# Patient Record
Sex: Male | Born: 1985 | Race: White | Hispanic: No | Marital: Single | Smoking: Current every day smoker
Health system: Southern US, Community
[De-identification: ages and names within clinical notes are randomized; demographics above are authoritative.]

## PROBLEM LIST (undated history)

## (undated) DIAGNOSIS — M1990 Unspecified osteoarthritis, unspecified site: Secondary | ICD-10-CM

## (undated) HISTORY — DX: Unspecified osteoarthritis, unspecified site: M19.90

---
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
## 2007-04-24 ENCOUNTER — Ambulatory Visit: Payer: Self-pay | Admitting: Pediatrics

## 2008-12-18 ENCOUNTER — Emergency Department (HOSPITAL_BASED_OUTPATIENT_CLINIC_OR_DEPARTMENT_OTHER): Admission: EM | Admit: 2008-12-18 | Discharge: 2008-12-18 | Payer: Self-pay | Admitting: Emergency Medicine

## 2015-02-05 ENCOUNTER — Ambulatory Visit (INDEPENDENT_AMBULATORY_CARE_PROVIDER_SITE_OTHER): Payer: BLUE CROSS/BLUE SHIELD | Admitting: Physician Assistant

## 2015-02-05 VITALS — BP 128/80 | HR 78 | Temp 98.2°F | Resp 18 | Ht 68.0 in | Wt 119.0 lb

## 2015-02-05 DIAGNOSIS — M25511 Pain in right shoulder: Secondary | ICD-10-CM | POA: Diagnosis not present

## 2015-02-05 MED ORDER — IBUPROFEN 800 MG PO TABS: ORAL_TABLET | ORAL | Status: AC

## 2015-02-05 NOTE — Progress Notes (Signed)
   02/05/2015 at 7:54 PM  Edward Johns / DOB: March 04, 1986 / MRN: [id number]  The patient  does not have a problem list on file.  SUBJECTIVE  Edward Johns is a 29 y.o. well appearing male presenting for the chief complaint of right sided shoulder pain that started last night.  Describes the pain as moderate, achy, and worse with movement. 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  Denies chest pain, SOB, neck pain, paresthesia and weakness of the right upper extremity. Denies sensation change in same.     He  has a past medical history of Arthritis.    Medications reviewed and updated by myself where necessary, and exist elsewhere in the encounter.   Edward Johns has no allergies on file. He  reports that he has been smoking.  He does not have any smokeless tobacco history on file. He reports that he drinks alcohol. He reports that he does not use illicit drugs. He  has no sexual activity history on file. The patient  has no past surgical history on file.  His family history is not on file.  ROS  Per HPI otherwise neg  OBJECTIVE  His  height is  (1.727 m) and weight is 119 lb (53.978 kg). His oral temperature is 98.2 F (36.8 C). His blood pressure is 128/80 and his pulse is 78. His respiration is 18 and oxygen saturation is 98%.  The patient's body mass index is 18.1 kg/(m^2).  Physical Exam  Constitutional: He is oriented to person, place, and time. He appears well-developed and well-nourished.  Cardiovascular: Normal rate.   Respiratory: Effort normal.  GI: He exhibits no distension.  Musculoskeletal: Normal range of motion.       Right shoulder: He exhibits tenderness and pain. He exhibits normal range of motion, no bony tenderness, no swelling, no effusion, no crepitus, no deformity, no laceration, no spasm, normal pulse and normal strength.       Arms: Neurological: He is alert and oriented to person, place,  and time. No cranial nerve deficit. Coordination normal.  Skin: Skin is warm and dry.  Psychiatric: He has a normal mood and affect.    No results found for this or any previous visit (from the past 24 hour(s)).  ASSESSMENT & PLAN  Edward Johns was seen today for shoulder pain.  Diagnoses and all orders for this visit:  Right shoulder pain: Most likely MSK in nature given that his pain is worse with movement and there is no trauma to speak of.  Will treat with NSAIDs, shoulder sling, and 2 weeks of light duty at work.  Patient advised to RTC in three weeks if symptoms are not improved.   -     ibuprofen (ADVIL,MOTRIN) 800 MG tablet; Take 1 tab every eight hours for pain. Take with food.   The patient was advised to call or come back to clinic if he does not see an improvement in symptoms, or worsens with the above plan.   Deliah Boston, MHS, PA-C [facility] 02/05/2015 7:54 PM

## 2017-08-05 ENCOUNTER — Other Ambulatory Visit: Payer: Self-pay | Admitting: Physician Assistant

## 2017-08-05 DIAGNOSIS — G4452 New daily persistent headache (NDPH): Secondary | ICD-10-CM

## 2017-08-12 ENCOUNTER — Ambulatory Visit
Admission: RE | Admit: 2017-08-12 | Discharge: 2017-08-12 | Disposition: A | Discharge status: Home / Self Care | Payer: BLUE CROSS/BLUE SHIELD | Source: Ambulatory Visit | Attending: Physician Assistant | Admitting: Physician Assistant

## 2017-08-12 DIAGNOSIS — G4452 New daily persistent headache (NDPH): Secondary | ICD-10-CM

## 2019-02-23 ENCOUNTER — Other Ambulatory Visit: Payer: Self-pay

## 2019-02-23 ENCOUNTER — Ambulatory Visit
Admission: RE | Admit: 2019-02-23 | Discharge: 2019-02-23 | Disposition: A | Discharge status: Home / Self Care | Payer: BLUE CROSS/BLUE SHIELD | Source: Ambulatory Visit | Attending: Physician Assistant | Admitting: Physician Assistant

## 2019-02-23 ENCOUNTER — Other Ambulatory Visit: Payer: Self-pay | Admitting: Physician Assistant

## 2019-02-23 DIAGNOSIS — M546 Pain in thoracic spine: Secondary | ICD-10-CM

## 2019-02-23 DIAGNOSIS — M545 Low back pain, unspecified: Secondary | ICD-10-CM

## 2019-02-23 DIAGNOSIS — M542 Cervicalgia: Secondary | ICD-10-CM

## 2019-02-23 DIAGNOSIS — G8929 Other chronic pain: Secondary | ICD-10-CM

## 2021-01-29 IMAGING — CR CERVICAL SPINE - 2-3 VIEW
3 series · 3 of 3 positions shown · non-contrast
Comparison: None.

CLINICAL DATA: Cervicalgia with upper extremity radicular symptoms

EXAM:
CERVICAL SPINE - 2-3 VIEW

[w cervical spine lat]
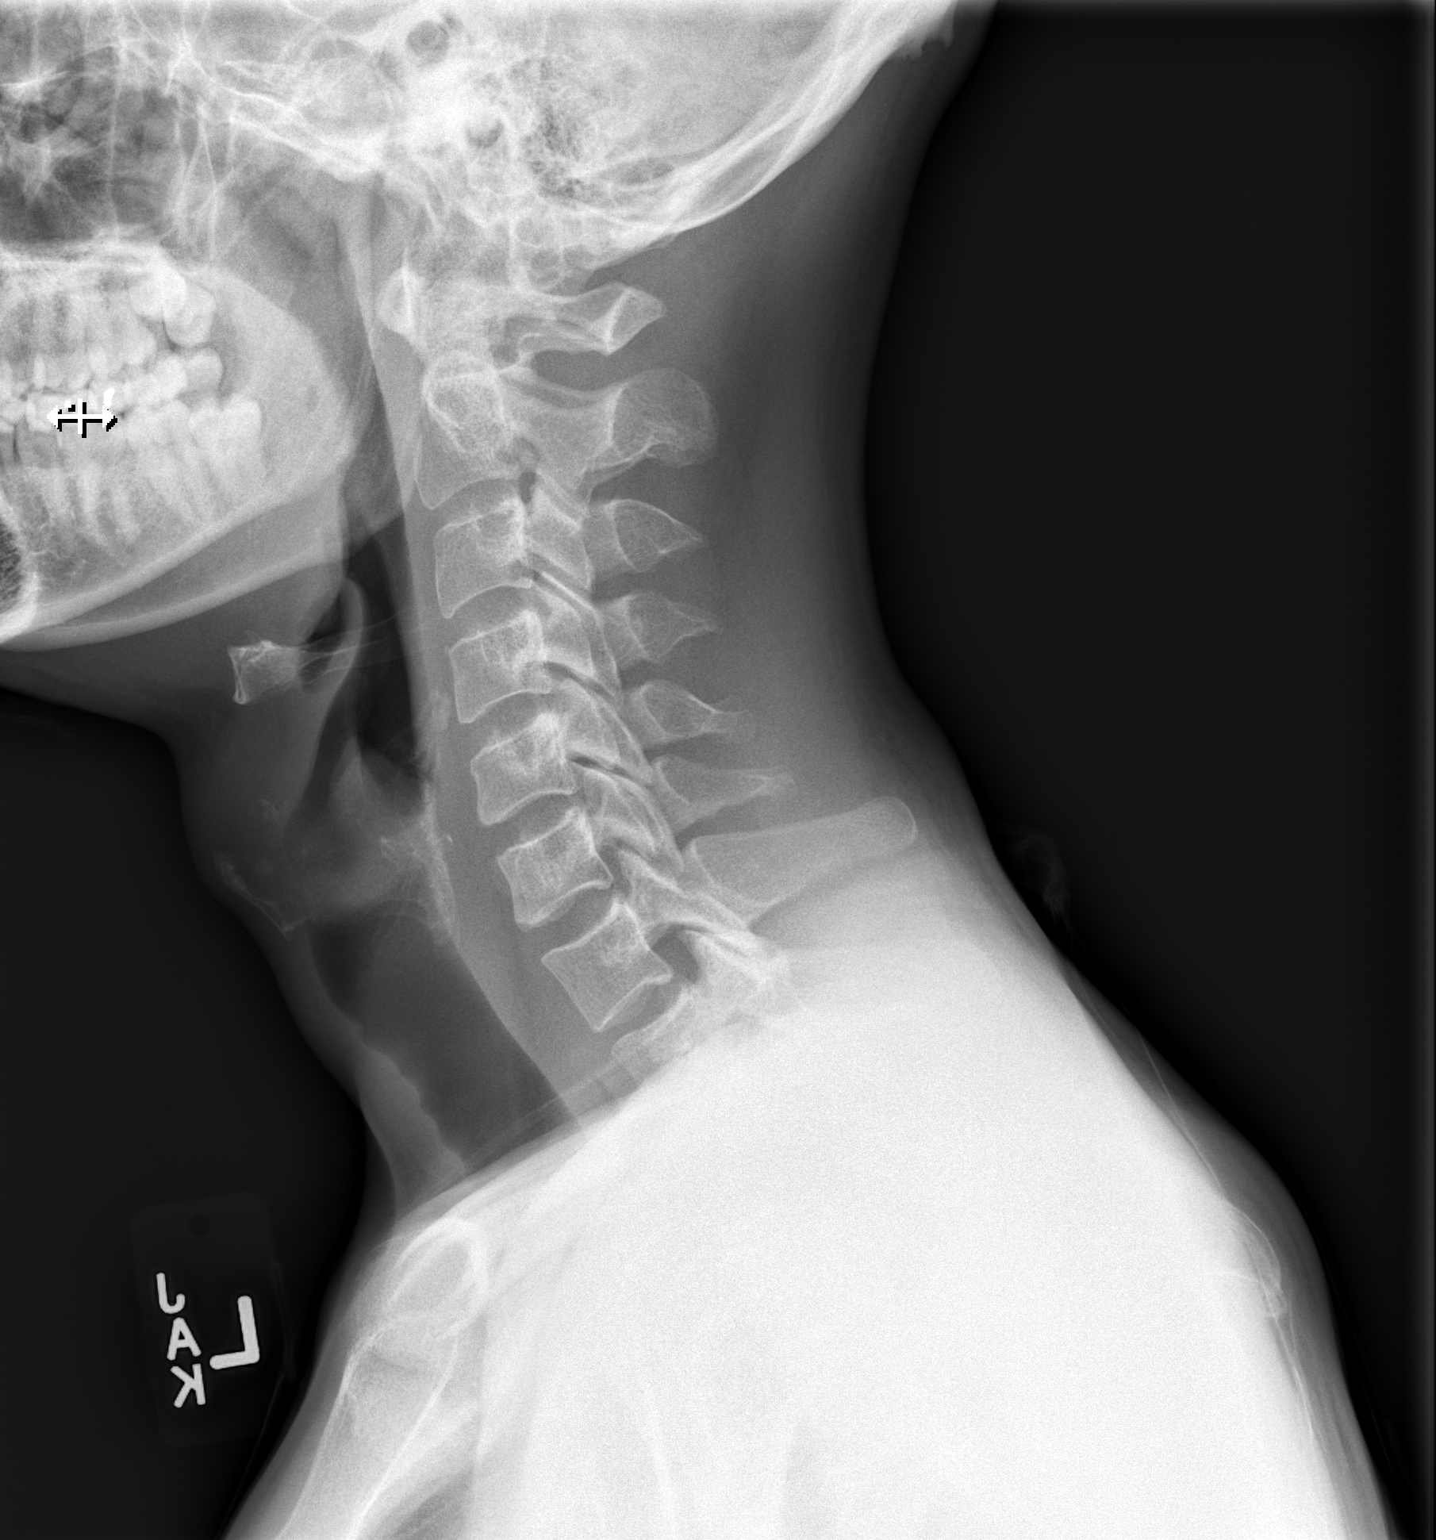

[w cervical spine ap (1 of 2)]
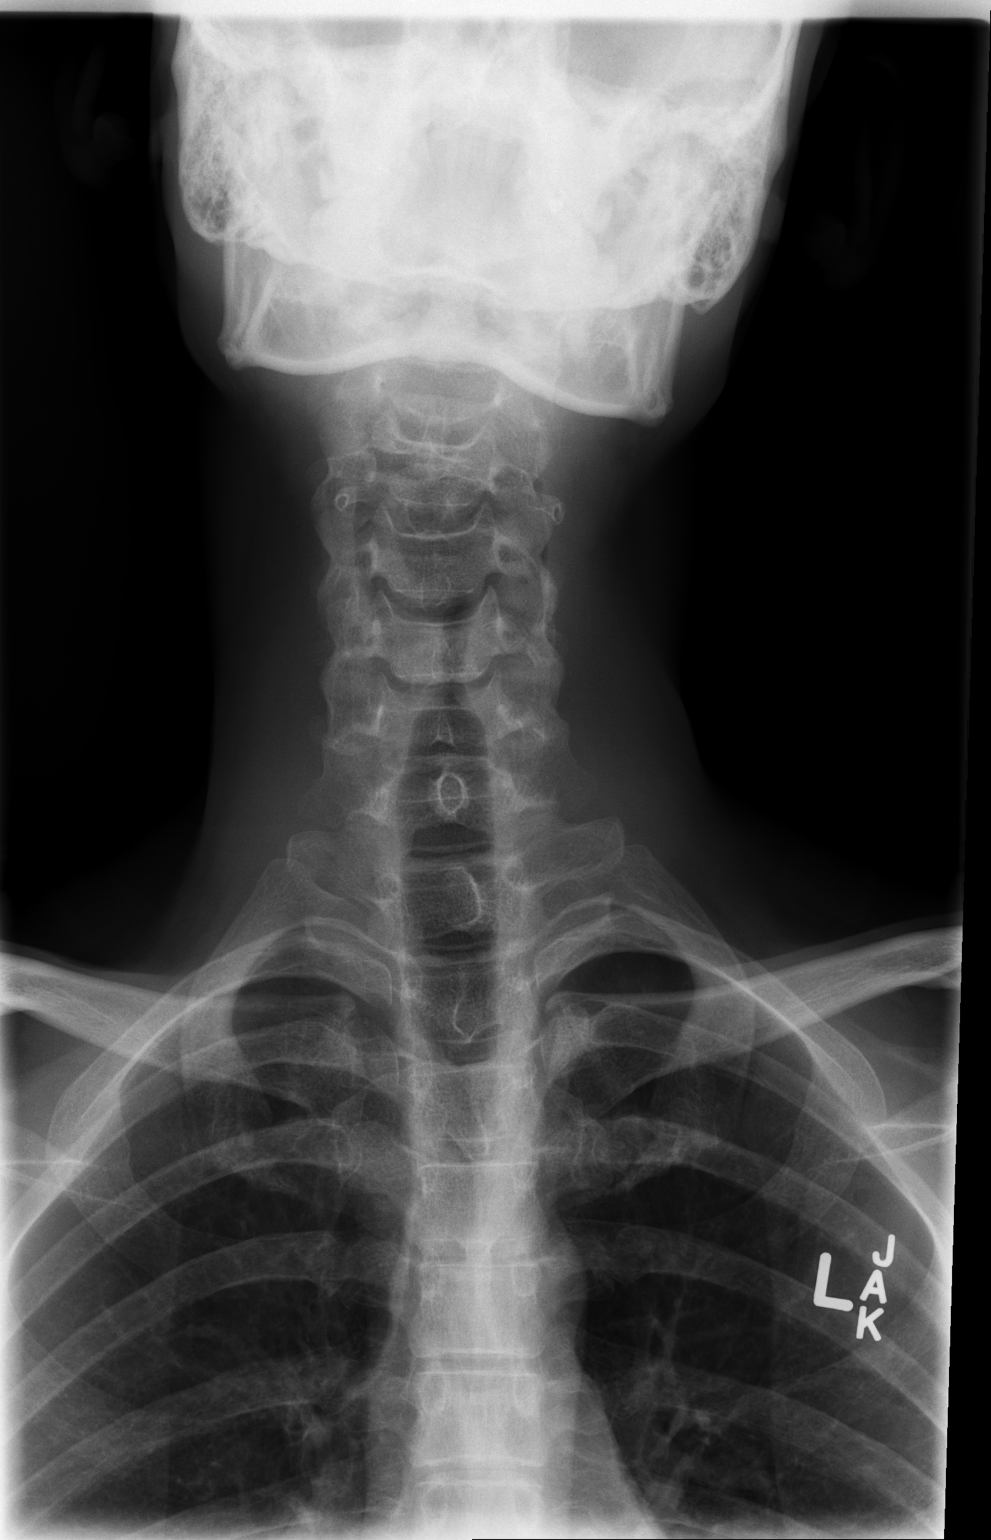

[w cervical spine ap (2 of 2)]
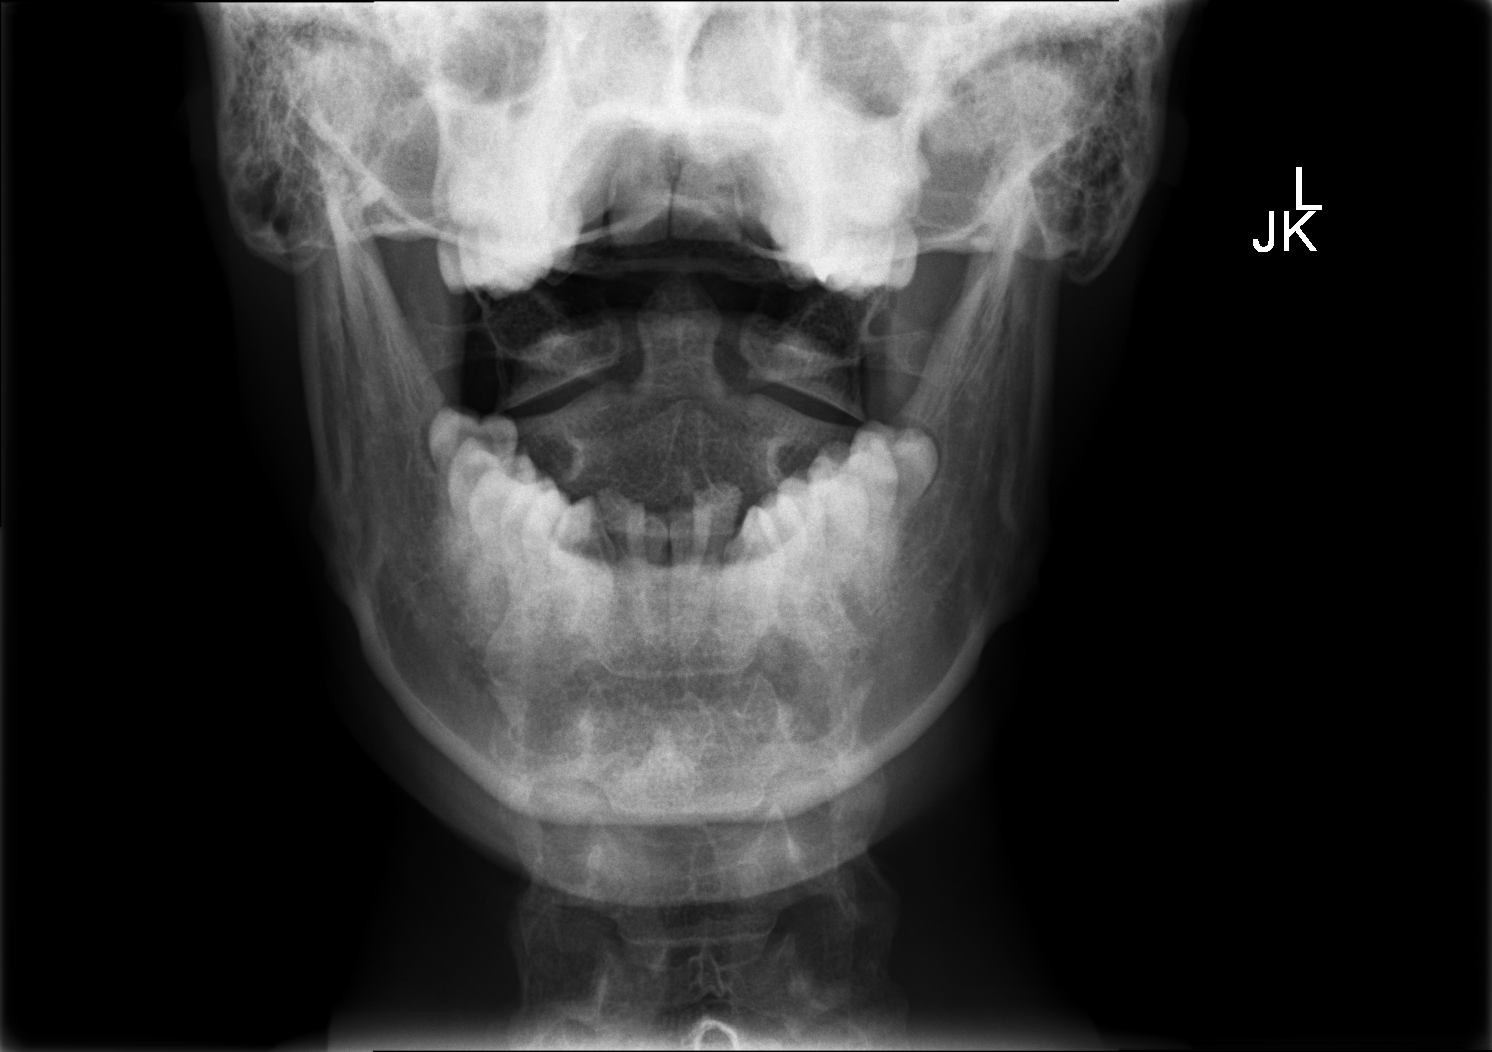

[3 of 3 positions shown; findings below may reference images not displayed]

FINDINGS: Frontal, lateral, and open-mouth odontoid images were obtained.
There is slight upper cervical dextroscoliosis. No fracture or
spondylolisthesis. Prevertebral soft tissues and predental space
regions are normal. The disc spaces appear normal. No erosive
changes. Lung apices are clear.
IMPRESSION: Mild scoliosis, a finding which may well be due to a degree of
muscle spasm. Note that there is preservation of cervical lordosis.
No fracture or spondylolisthesis. No evident arthropathy.
